# Patient Record
Sex: Female | Born: 1955 | Race: Black or African American | Hispanic: No | Smoking: Current every day smoker
Health system: Southern US, Community
[De-identification: ages and names within clinical notes are randomized; demographics above are authoritative.]

## PROBLEM LIST (undated history)

## (undated) DIAGNOSIS — E119 Type 2 diabetes mellitus without complications: Secondary | ICD-10-CM

## (undated) DIAGNOSIS — I10 Essential (primary) hypertension: Secondary | ICD-10-CM

## (undated) HISTORY — PX: VASCULAR SURGERY: SHX849

## (undated) HISTORY — PX: CARPAL TUNNEL RELEASE: SHX101

---

## 2015-10-29 ENCOUNTER — Encounter (HOSPITAL_COMMUNITY): Payer: Self-pay | Admitting: Emergency Medicine

## 2015-10-29 ENCOUNTER — Ambulatory Visit (INDEPENDENT_AMBULATORY_CARE_PROVIDER_SITE_OTHER): Payer: Self-pay

## 2015-10-29 ENCOUNTER — Ambulatory Visit (HOSPITAL_COMMUNITY)
Admission: EM | Admit: 2015-10-29 | Discharge: 2015-10-29 | Disposition: A | Discharge status: Home / Self Care | Payer: Self-pay | Attending: Family Medicine | Admitting: Family Medicine

## 2015-10-29 DIAGNOSIS — M609 Myositis, unspecified: Secondary | ICD-10-CM

## 2015-10-29 DIAGNOSIS — M5136 Other intervertebral disc degeneration, lumbar region: Secondary | ICD-10-CM

## 2015-10-29 DIAGNOSIS — M25511 Pain in right shoulder: Secondary | ICD-10-CM

## 2015-10-29 DIAGNOSIS — M545 Low back pain, unspecified: Secondary | ICD-10-CM

## 2015-10-29 DIAGNOSIS — M4316 Spondylolisthesis, lumbar region: Secondary | ICD-10-CM

## 2015-10-29 MED ORDER — MELOXICAM 15 MG PO TABS: 15.0000 mg | ORAL_TABLET | Freq: Every day | ORAL | Status: DC

## 2015-10-29 MED ORDER — HYDROCODONE-ACETAMINOPHEN 7.5-325 MG PO TABS: 1.0000 | ORAL_TABLET | ORAL | Status: DC | PRN

## 2015-10-29 NOTE — Discharge Instructions (Signed)
Back Pain, Adult °Back pain is very common in adults. The cause of back pain is rarely dangerous and the pain often gets better over time. The cause of your back pain may not be known. Some common causes of back pain include: °· Strain of the muscles or ligaments supporting the spine. °· Wear and tear (degeneration) of the spinal disks. °· Arthritis. °· Direct injury to the back. °For many people, back pain may return. Since back pain is rarely dangerous, most people can learn to manage this condition on their own. °HOME CARE INSTRUCTIONS °Watch your back pain for any changes. The following actions may help to lessen any discomfort you are feeling: °· Remain active. It is stressful on your back to sit or stand in one place for long periods of time. Do not sit, drive, or stand in one place for more than 30 minutes at a time. Take short walks on even surfaces as soon as you are able. Try to increase the length of time you walk each day. °· Exercise regularly as directed by your health care provider. Exercise helps your back heal faster. It also helps avoid future injury by keeping your muscles strong and flexible. °· Do not stay in bed. Resting more than 1-2 days can delay your recovery. °· Pay attention to your body when you bend and lift. The most comfortable positions are those that put less stress on your recovering back. Always use proper lifting techniques, including: °· Bending your knees. °· Keeping the load close to your body. °· Avoiding twisting. °· Find a comfortable position to sleep. Use a firm mattress and lie on your side with your knees slightly bent. If you lie on your back, put a pillow under your knees. °· Avoid feeling anxious or stressed. Stress increases muscle tension and can worsen back pain. It is important to recognize when you are anxious or stressed and learn ways to manage it, such as with exercise. °· Take medicines only as directed by your health care provider. Over-the-counter  medicines to reduce pain and inflammation are often the most helpful. Your health care provider may prescribe muscle relaxant drugs. These medicines help dull your pain so you can more quickly return to your normal activities and healthy exercise. °· Apply ice to the injured area: °· Put ice in a plastic bag. °· Place a towel between your skin and the bag. °· Leave the ice on for 20 minutes, 2-3 times a day for the first 2-3 days. After that, ice and heat may be alternated to reduce pain and spasms. °· Maintain a healthy weight. Excess weight puts extra stress on your back and makes it difficult to maintain good posture. °SEEK MEDICAL CARE IF: °· You have pain that is not relieved with rest or medicine. °· You have increasing pain going down into the legs or buttocks. °· You have pain that does not improve in one week. °· You have night pain. °· You lose weight. °· You have a fever or chills. °SEEK IMMEDIATE MEDICAL CARE IF:  °· You develop new bowel or bladder control problems. °· You have unusual weakness or numbness in your arms or legs. °· You develop nausea or vomiting. °· You develop abdominal pain. °· You feel faint. °  °This information is not intended to replace advice given to you by your health care provider. Make sure you discuss any questions you have with your health care provider. °  °Document Released: 03/31/2005 Document Revised: 04/21/2014 Document Reviewed: 08/02/2013 °Elsevier Interactive Patient Education ©2016 [employer]  Chronic Back Pain  When back pain lasts longer than 3 months, it is called chronic back pain.People with chronic back pain often go through certain periods that are more intense (flare-ups).  CAUSES Chronic back pain can be caused by wear and tear (degeneration) on different structures in your back. These structures include:  The bones of your spine (vertebrae) and the joints surrounding your spinal cord and nerve roots (facets).  The strong, fibrous tissues that  connect your vertebrae (ligaments). Degeneration of these structures may result in pressure on your nerves. This can lead to constant pain. HOME CARE INSTRUCTIONS  Avoid bending, heavy lifting, prolonged sitting, and activities which make the problem worse.  Take brief periods of rest throughout the day to reduce your pain. Lying down or standing usually is better than sitting while you are resting.  Take over-the-counter or prescription medicines only as directed by your caregiver. SEEK IMMEDIATE MEDICAL CARE IF:   You have weakness or numbness in one of your legs or feet.  You have trouble controlling your bladder or bowels.  You have nausea, vomiting, abdominal pain, shortness of breath, or fainting.   This information is not intended to replace advice given to you by your health care provider. Make sure you discuss any questions you have with your health care provider.   Document Released: 05/08/2004 Document Revised: 06/23/2011 Document Reviewed: 09/18/2014 Elsevier Interactive Patient Education 2016 [employer]  Facet Syndrome Facet syndrome is a condition where injury to the small joints between the bones in the spine (facet joints) causes back pain. Over rotation (twisting) or arching (extension) of the back may injure the joints or the soft disks between the spinal bones. Such injuries result in excessive motion of the facet joint. This causes the cartilage covering the facet joint to wear down. That places pressure on nerves, as they exit the spinal cord.  SYMPTOMS   Chronic dull ache in the low back, that gets worse with over-extension and rotation.  Pain in the low back, buttocks, hip, and sometimes leg.  Sometimes, stiffness of the low back. CAUSES  Facet syndrome is often caused by repeated or over rotation, over-extension, or extension with rotation of the back. These motions cause injury to the cartilage covering the facet joints. This places pressure on the spinal  nerves. RISK INCREASES WITH:  Sports that can cause over-extension of the back, with rotation or repeatedly (golf, football, gymnastics, diving, weight-lifting, dancing, rifle shooting, wrestling, tennis, swimming, volleyball, track and field, rugby, other contact sports).  Poor back strength and flexibility.  Poor exercise technique. PREVENTION   Learn and use proper technique.  Warm up and stretch properly before activity.  Maintain physical fitness:  Back and hamstring flexibility.  Back muscle strength and endurance.  Cardiovascular fitness. PROGNOSIS  This condition is often resolved with proper non-surgical treatment.  RELATED COMPLICATIONS   Recurring symptoms, resulting in a chronic problem.  Delayed healing, especially if sports are resumed too soon.  Prolonged impairment.  Narrowed canal for the spinal cord, due to bone spurs (bumps) resulting from chronic erosion of the facet joints (spinal stenosis). TREATMENT  Treatment first involves stopping activities that aggravate your symptoms. Ice and medicines may be used to reduce pain and inflammation. Your caregiver may advise strength and stretching activities, to be completed at home or with a therapist. You may be referred to a physical therapist for further treatment, including: ultrasound, manual adjustments, transcutaneous electronic nerve stimulation (TENS). Surgery is rarely needed. It  is reserved for athletes with persistent pain, despite 6 to 12 months of proper non-surgical treatment. Surgery involves joining (fusing) two bones of the spinal column, to stop motion between the facet joint and disk. MEDICATION   If pain medicine is needed, nonsteroidal anti-inflammatory medicines (aspirin and ibuprofen), or other minor pain relievers (acetaminophen), are often advised.  Do not take pain medicine for 7 days before surgery.  Stronger pain relievers may be prescribed. Use only as directed and only as much as you  need. HEAT AND COLD  Cold treatment (icing) relieves pain and reduces inflammation. Cold treatment should be applied for 10 to 15 minutes every 2 to 3 hours, and immediately after activity that aggravates your symptoms. Use ice packs or an ice massage.  Heat treatment may be used before performing stretching and strengthening activities advised by your caregiver, physical therapist, or athletic trainer. Use a heat pack or a warm water soak. SEEK MEDICAL CARE IF:   Symptoms get worse or do not improve in 2 to 4 weeks, despite treatment.  You develop numbness, weakness, or loss of bladder or bowel function.  New, unexplained symptoms develop. (Drugs used in treatment may produce side effects.)   This information is not intended to replace advice given to you by your health care provider. Make sure you discuss any questions you have with your health care provider.   Document Released: 03/31/2005 Document Revised: 06/23/2011 Document Reviewed: 12/11/2014 Elsevier Interactive Patient Education 2016 [employer]  Musculoskeletal Pain Musculoskeletal pain is muscle and boney aches and pains. These pains can occur in any part of the body. Your caregiver may treat you without knowing the cause of the pain. They may treat you if blood or urine tests, X-rays, and other tests were normal.  CAUSES There is often not a definite cause or reason for these pains. These pains may be caused by a type of germ (virus). The discomfort may also come from overuse. Overuse includes working out too hard when your body is not fit. Boney aches also come from weather changes. Bone is sensitive to atmospheric pressure changes. HOME CARE INSTRUCTIONS   Ask when your test results will be ready. Make sure you get your test results.  Only take over-the-counter or prescription medicines for pain, discomfort, or fever as directed by your caregiver. If you were given medications for your condition, do not drive, operate  machinery or power tools, or sign legal documents for 24 hours. Do not drink alcohol. Do not take sleeping pills or other medications that may interfere with treatment.  Continue all activities unless the activities cause more pain. When the pain lessens, slowly resume normal activities. Gradually increase the intensity and duration of the activities or exercise.  During periods of severe pain, bed rest may be helpful. Lay or sit in any position that is comfortable.  Putting ice on the injured area.  Put ice in a bag.  Place a towel between your skin and the bag.  Leave the ice on for 15 to 20 minutes, 3 to 4 times a day.  Follow up with your caregiver for continued problems and no reason can be found for the pain. If the pain becomes worse or does not go away, it may be necessary to repeat tests or do additional testing. Your caregiver may need to look further for a possible cause. SEEK IMMEDIATE MEDICAL CARE IF:  You have pain that is getting worse and is not relieved by medications.  You develop chest  pain that is associated with shortness or breath, sweating, feeling sick to your stomach (nauseous), or throw up (vomit).  Your pain becomes localized to the abdomen.  You develop any new symptoms that seem different or that concern you. MAKE SURE YOU:   Understand these instructions.  Will watch your condition.  Will get help right away if you are not doing well or get worse.   This information is not intended to replace advice given to you by your health care provider. Make sure you discuss any questions you have with your health care provider.   Document Released: 03/31/2005 Document Revised: 06/23/2011 Document Reviewed: 12/03/2012 Elsevier Interactive Patient Education 2016 [employer] Inc[employer]  Myofascial Pain Syndrome and Fibromyalgia Myofascial pain syndrome and fibromyalgia are both pain disorders. This pain may be felt mainly in your muscles.   Myofascial pain  syndrome:  Always has trigger points or tender points in the muscle that will cause pain when pressed. The pain may come and go.  Usually affects your neck, upper back, and shoulder areas. The pain often radiates into your arms and hands.  Fibromyalgia:  Has muscle pains and tenderness that come and go.  Is often associated with fatigue and sleep disturbances.  Has trigger points.  Tends to be long-lasting (chronic), but is not life-threatening. Fibromyalgia and myofascial pain are not the same. However, they often occur together. If you have both conditions, each can make the other worse. Both are common and can cause enough pain and fatigue to make day-to-day activities difficult.  CAUSES  The exact causes of fibromyalgia and myofascial pain are not known. People with certain gene types may be more likely to develop fibromyalgia. Some factors can be triggers for both conditions, such as:   Spine disorders.  Arthritis.  Severe injury (trauma) and other physical stressors.  Being under a lot of stress.  A medical illness. SIGNS AND SYMPTOMS  Fibromyalgia The main symptom of fibromyalgia is widespread pain and tenderness in your muscles. This can vary over time. Pain is sometimes described as stabbing, shooting, or burning. You may have tingling or numbness, too. You may also have sleep problems and fatigue. You may wake up feeling tired and groggy (fibro fog). Other symptoms may include:   Bowel and bladder problems.  Headaches.  Visual problems.  Problems with odors and noises.  Depression or mood changes.  Painful menstrual periods (dysmenorrhea).  Dry skin or eyes. Myofascial pain syndrome Symptoms of myofascial pain syndrome include:   Tight, ropy bands of muscle.   Uncomfortable sensations in muscular areas, such as:  Aching.  Cramping.  Burning.  Numbness.  Tingling.   Muscle weakness.  Trouble moving certain muscles freely (range of  motion). DIAGNOSIS  There are no specific tests to diagnose fibromyalgia or myofascial pain syndrome. Both can be hard to diagnose because their symptoms are common in many other conditions. Your health care provider may suspect one or both of these conditions based on your symptoms and medical history. Your health care provider will also do a physical exam.  The key to diagnosing fibromyalgia is having pain, fatigue, and other symptoms for more than three months that cannot be explained by another condition.  The key to diagnosing myofascial pain syndrome is finding trigger points in muscles that are tender and cause pain elsewhere in your body (referred pain). TREATMENT  Treating fibromyalgia and myofascial pain often requires a team of health care providers. This usually starts with your primary provider and a physical  therapist. Alison Duncan may also find it helpful to work with alternative health care providers, such as massage therapists or acupuncturists. Treatment for fibromyalgia may include medicines. This may include nonsteroidal anti-inflammatory drugs (NSAIDs), along with other medicines.  Treatment for myofascial pain may also include:  NSAIDs.  Cooling and stretching of muscles.  Trigger point injections.  Sound wave (ultrasound) treatments to stimulate muscles. HOME CARE INSTRUCTIONS   Take medicines only as directed by your health care provider.  Exercise as directed by your health care provider or physical therapist.  Try to avoid stressful situations.  Practice relaxation techniques to control your stress. You may want to try:  Biofeedback.  Visual imagery.  Hypnosis.  Muscle relaxation.  Yoga.  Meditation.  Talk to your health care provider about alternative treatments, such as acupuncture or massage treatment.  Maintain a healthy lifestyle. This includes eating a healthy diet and getting enough sleep.  Consider joining a support group.  Do not do activities  that stress or strain your muscles. That includes repetitive motions and heavy lifting. SEEK MEDICAL CARE IF:   You have new symptoms.  Your symptoms get worse.  You have side effects from your medicines.  You have trouble sleeping.  Your condition is causing depression or anxiety. FOR MORE INFORMATION   National Fibromyalgia Association: http://www.fmaware.orgwww.fmaware.org  Arthritis Foundation: http://www.arthritis.orgwww.arthritis.org  American Chronic Pain Association: michaeledo.com.CandyDash.co.za   This information is not intended to replace advice given to you by your health care provider. Make sure you discuss any questions you have with your health care provider.   Document Released: 03/31/2005 Document Revised: 04/21/2014 Document Reviewed: 01/04/2014 Elsevier Interactive Patient Education [employer]
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
## 2016-01-25 ENCOUNTER — Emergency Department (HOSPITAL_COMMUNITY): Payer: Medicare Other

## 2016-01-25 ENCOUNTER — Emergency Department (HOSPITAL_COMMUNITY)
Admission: EM | Admit: 2016-01-25 | Discharge: 2016-01-25 | Disposition: A | Discharge status: Home / Self Care | Payer: Medicare Other | Attending: Emergency Medicine | Admitting: Emergency Medicine

## 2016-01-25 ENCOUNTER — Encounter (HOSPITAL_COMMUNITY): Payer: Self-pay | Admitting: Emergency Medicine

## 2016-01-25 DIAGNOSIS — Z794 Long term (current) use of insulin: Secondary | ICD-10-CM | POA: Insufficient documentation

## 2016-01-25 DIAGNOSIS — R05 Cough: Secondary | ICD-10-CM | POA: Insufficient documentation

## 2016-01-25 DIAGNOSIS — Z7982 Long term (current) use of aspirin: Secondary | ICD-10-CM | POA: Diagnosis not present

## 2016-01-25 DIAGNOSIS — F17200 Nicotine dependence, unspecified, uncomplicated: Secondary | ICD-10-CM | POA: Insufficient documentation

## 2016-01-25 DIAGNOSIS — I10 Essential (primary) hypertension: Secondary | ICD-10-CM | POA: Insufficient documentation

## 2016-01-25 DIAGNOSIS — R059 Cough, unspecified: Secondary | ICD-10-CM

## 2016-01-25 DIAGNOSIS — E119 Type 2 diabetes mellitus without complications: Secondary | ICD-10-CM | POA: Insufficient documentation

## 2016-01-25 HISTORY — DX: Type 2 diabetes mellitus without complications: E11.9

## 2016-01-25 HISTORY — DX: Essential (primary) hypertension: I10

## 2016-01-25 LAB — BASIC METABOLIC PANEL
Anion gap: 7 (ref 5–15)
BUN: 8 mg/dL (ref 6–20)
CALCIUM: 9.2 mg/dL (ref 8.9–10.3)
CHLORIDE: 103 mmol/L (ref 101–111)
CO2: 28 mmol/L (ref 22–32)
CREATININE: 0.72 mg/dL (ref 0.44–1.00)
GFR calc Af Amer: >60 mL/min (ref >60)
GFR calc non Af Amer: >60 mL/min (ref >60)
GLUCOSE: 318 mg/dL — AB (ref 65–99)
Potassium: 3.5 mmol/L (ref 3.5–5.1)
Sodium: 138 mmol/L (ref 135–145)

## 2016-01-25 LAB — CBC WITH DIFFERENTIAL/PLATELET
BASOS PCT: 0 %
Basophils Absolute: 0 10*3/uL (ref 0.0–0.1)
EOS ABS: 0.1 10*3/uL (ref 0.0–0.7)
Eosinophils Relative: 1 %
HCT: 40 % (ref 36.0–46.0)
HEMOGLOBIN: 13.7 g/dL (ref 12.0–15.0)
LYMPHS ABS: 2.4 10*3/uL (ref 0.7–4.0)
Lymphocytes Relative: 29 %
MCH: 28.5 pg (ref 26.0–34.0)
MCHC: 34.3 g/dL (ref 30.0–36.0)
MCV: 83.3 fL (ref 78.0–100.0)
MONO ABS: 0.5 10*3/uL (ref 0.1–1.0)
MONOS PCT: 6 %
Neutro Abs: 5.5 10*3/uL (ref 1.7–7.7)
Neutrophils Relative %: 64 %
Platelets: 255 10*3/uL (ref 150–400)
RBC: 4.8 MIL/uL (ref 3.87–5.11)
RDW: 15.2 % (ref 11.5–15.5)
WBC: 8.4 10*3/uL (ref 4.0–10.5)

## 2016-01-25 LAB — I-STAT TROPONIN, ED: Troponin i, poc: 0 ng/mL (ref 0.00–0.08)

## 2016-01-25 LAB — SEDIMENTATION RATE: SED RATE: 52 mm/h — AB (ref 0–22)

## 2016-01-25 LAB — C-REACTIVE PROTEIN: CRP: 1 mg/dL — AB (ref <1.0)

## 2016-01-25 MED ORDER — BENZONATATE 100 MG PO CAPS
100.0000 mg | ORAL_CAPSULE | Freq: Once | ORAL | Status: AC
  Administered 2016-01-25: 100 mg via ORAL
  Filled 2016-01-25: qty 1

## 2016-01-25 MED ORDER — IOPAMIDOL (ISOVUE-300) INJECTION 61%
INTRAVENOUS | Status: AC
  Administered 2016-01-25: 75 mL
  Filled 2016-01-25: qty 75

## 2016-01-25 MED ORDER — IPRATROPIUM-ALBUTEROL 0.5-2.5 (3) MG/3ML IN SOLN
3.0000 mL | Freq: Once | RESPIRATORY_TRACT | Status: AC
  Administered 2016-01-25: 3 mL via RESPIRATORY_TRACT
  Filled 2016-01-25: qty 3

## 2016-01-25 NOTE — ED Notes (Signed)
Pt returned from CT °

## 2016-01-25 NOTE — ED Notes (Signed)
Patient transported to CT 

## 2016-01-25 NOTE — ED Triage Notes (Signed)
60 yo female, smoker, c/o persistent productive cough, white sputum. Also, c/o sore throat and CP with cough.

## 2016-01-25 NOTE — ED Notes (Signed)
LAB TECH IN TO DRAW BLOOD.
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
## 2016-01-25 NOTE — ED Notes (Signed)
Returned from xray

## 2016-01-25 NOTE — ED Notes (Signed)
PT SIGNED OUT AMA. PT AGREED TO RETURN THIS PM AFTER SETTLING FAMILY.

## 2016-01-25 NOTE — ED Notes (Signed)
Patient transported to X-ray 

## 2016-01-26 LAB — HIV ANTIBODY (ROUTINE TESTING W REFLEX): HIV Screen 4th Generation wRfx: NONREACTIVE

## 2016-01-28 ENCOUNTER — Emergency Department (HOSPITAL_COMMUNITY)
Admission: EM | Admit: 2016-01-28 | Discharge: 2016-01-28 | Disposition: A | Discharge status: Home / Self Care | Payer: Medicare Other | Attending: Emergency Medicine | Admitting: Emergency Medicine

## 2016-01-28 ENCOUNTER — Encounter (HOSPITAL_COMMUNITY): Payer: Self-pay | Admitting: Emergency Medicine

## 2016-01-28 DIAGNOSIS — R05 Cough: Secondary | ICD-10-CM

## 2016-01-28 DIAGNOSIS — Z7982 Long term (current) use of aspirin: Secondary | ICD-10-CM | POA: Insufficient documentation

## 2016-01-28 DIAGNOSIS — F17210 Nicotine dependence, cigarettes, uncomplicated: Secondary | ICD-10-CM | POA: Insufficient documentation

## 2016-01-28 DIAGNOSIS — E119 Type 2 diabetes mellitus without complications: Secondary | ICD-10-CM | POA: Insufficient documentation

## 2016-01-28 DIAGNOSIS — Z7984 Long term (current) use of oral hypoglycemic drugs: Secondary | ICD-10-CM | POA: Diagnosis not present

## 2016-01-28 DIAGNOSIS — I10 Essential (primary) hypertension: Secondary | ICD-10-CM | POA: Insufficient documentation

## 2016-01-28 DIAGNOSIS — R918 Other nonspecific abnormal finding of lung field: Secondary | ICD-10-CM | POA: Diagnosis not present

## 2016-01-28 DIAGNOSIS — Z794 Long term (current) use of insulin: Secondary | ICD-10-CM | POA: Insufficient documentation

## 2016-01-28 DIAGNOSIS — Z79899 Other long term (current) drug therapy: Secondary | ICD-10-CM | POA: Insufficient documentation

## 2016-01-28 DIAGNOSIS — R059 Cough, unspecified: Secondary | ICD-10-CM

## 2016-01-28 LAB — CBC WITH DIFFERENTIAL/PLATELET
BASOS PCT: 1 %
Basophils Absolute: 0.1 10*3/uL (ref 0.0–0.1)
EOS PCT: 2 %
Eosinophils Absolute: 0.2 10*3/uL (ref 0.0–0.7)
HEMATOCRIT: 37.4 % (ref 36.0–46.0)
HEMOGLOBIN: 12.8 g/dL (ref 12.0–15.0)
LYMPHS PCT: 34 %
Lymphs Abs: 2.9 10*3/uL (ref 0.7–4.0)
MCH: 28.3 pg (ref 26.0–34.0)
MCHC: 34.2 g/dL (ref 30.0–36.0)
MCV: 82.6 fL (ref 78.0–100.0)
MONO ABS: 0.5 10*3/uL (ref 0.1–1.0)
MONOS PCT: 6 %
NEUTROS PCT: 57 %
Neutro Abs: 4.7 10*3/uL (ref 1.7–7.7)
PLATELETS: 248 10*3/uL (ref 150–400)
RBC: 4.53 MIL/uL (ref 3.87–5.11)
RDW: 15 % (ref 11.5–15.5)
WBC: 8.4 10*3/uL (ref 4.0–10.5)

## 2016-01-28 LAB — HEPATIC FUNCTION PANEL
ALBUMIN: 3 g/dL — AB (ref 3.5–5.0)
ALT: 13 U/L — ABNORMAL LOW (ref 14–54)
AST: 18 U/L (ref 15–41)
Alkaline Phosphatase: 98 U/L (ref 38–126)
Bilirubin, Direct: 0.1 mg/dL (ref 0.1–0.5)
Indirect Bilirubin: 0.5 mg/dL (ref 0.3–0.9)
TOTAL PROTEIN: 6.7 g/dL (ref 6.5–8.1)
Total Bilirubin: 0.6 mg/dL (ref 0.3–1.2)

## 2016-01-28 LAB — CBG MONITORING, ED: GLUCOSE-CAPILLARY: 178 mg/dL — AB (ref 65–99)

## 2016-01-28 MED ORDER — AZITHROMYCIN 250 MG PO TABS: 250.0000 mg | ORAL_TABLET | Freq: Every day | ORAL | 0 refills | Status: DC

## 2016-01-28 MED ORDER — CEFDINIR 300 MG PO CAPS: 300.0000 mg | ORAL_CAPSULE | Freq: Two times a day (BID) | ORAL | 0 refills | Status: DC

## 2016-01-28 NOTE — ED Provider Notes (Addendum)
[facility] DEPT Provider Note   CSN: [id number] Arrival date & time: 01/28/16  4098     History   Chief Complaint Chief Complaint  Patient presents with  . Abnormal Lab  . Cough    HPI Alison Duncan is a 60 y.o. female.  HPI Patient presents after leaving AMA from the ER 3 days ago. She has had a cough for about a year. Worse last 3 weeks. No weight loss. Has white sputum occasionally. Seen Friday had an abnormal chest x-ray and CT scan that showed multiple cavitary lesions. Patient states she was not able to stay that she had some family issues. No fevers. No weight loss. He does smoke cigarettes. No known sick contacts. She moved to [place] 2 months ago. She does not have a primary care doctor here yet.  Past Medical History:  Diagnosis Date  . Diabetes mellitus without complication (HCC)   . Hypertension     There are no active problems to display for this patient.   Past Surgical History:  Procedure Laterality Date  . CARPAL TUNNEL RELEASE    . VASCULAR SURGERY     STENT IN LEFT LEG.    OB History    No data available       Home Medications    Prior to Admission medications   Medication Sig Start Date End Date Taking? Authorizing Provider  aspirin 81 MG tablet Take 81 mg by mouth daily.    Historical Provider, MD  HYDROcodone-acetaminophen (NORCO) 7.5-325 MG tablet Take 1 tablet by mouth every 4 (four) hours as needed. 10/29/15   Hayden Rasmussen, NP  insulin detemir (LEVEMIR) 100 UNIT/ML injection Inject into the skin at bedtime.    Historical Provider, MD  Iron Combinations (NIFEREX PO) Take by mouth.    Historical Provider, MD  meloxicam (MOBIC) 15 MG tablet Take 1 tablet (15 mg total) by mouth daily. 10/29/15   Hayden Rasmussen, NP  sitaGLIPtin (JANUVIA) 50 MG tablet Take 50 mg by mouth daily.    Historical Provider, MD    Family History History reviewed. No pertinent family history.  Social History Social History  Substance Use Topics  . Smoking  status: Current Every Day Smoker  . Smokeless tobacco: Never Used  . Alcohol use Yes     Allergies   Review of patient's allergies indicates no known allergies.   Review of Systems Review of Systems  Constitutional: Negative for appetite change and unexpected weight change.  HENT: Negative for dental problem.   Respiratory: Positive for cough and shortness of breath.   Cardiovascular: Negative for chest pain.  Gastrointestinal: Negative for abdominal pain.  Genitourinary: Negative for dysuria.  Musculoskeletal: Negative for back pain.  Neurological: Negative for seizures.  Psychiatric/Behavioral: Negative for confusion.     Physical Exam Updated Vital Signs BP 138/97 (BP Location: Right Arm)   Pulse 85   Temp 98.2 F (36.8 C) (Oral)   Resp 18   Ht 5\' 4"  (1.626 m)   Wt 185 lb (83.9 kg)   SpO2 100%   BMI 31.76 kg/m   Physical Exam  Constitutional: She appears well-developed.  HENT:  Head: Atraumatic.  Cardiovascular: Normal rate.   Pulmonary/Chest: No respiratory distress.  Mild scattered wheezes.  Abdominal: Soft. There is no tenderness.  Musculoskeletal: Normal range of motion.  Neurological: She is alert.  Skin: Skin is warm. Capillary refill takes less than 2 seconds.     ED Treatments / Results  Labs (all labs ordered are listed,  but only abnormal results are displayed) Labs Reviewed  CBG MONITORING, ED - Abnormal; Notable for the following:       Result Value   Glucose-Capillary 178 (*)    All other components within normal limits    EKG  EKG Interpretation None       Radiology No results found.  Procedures Procedures (including critical care time)  Medications Ordered in ED Medications - No data to display   Initial Impression / Assessment and Plan / ED Course  I have reviewed the triage vital signs and the nursing notes.  Pertinent labs & imaging results that were available during my care of the patient were reviewed by me and  considered in my medical decision making (see chart for details).  Clinical Course  Patient presents with shortness of breath. Seen on Friday and had CT scan with multiple nodules. Would not be admitted that time. Now returns. Does not have follow-up. To be seen by internal medicine residents.  Internal medicine is seen the patient and arrange follow-up in 2 days in the office. We'll give antibiotics. A few more labs added. Discharge home.  Final Clinical Impressions(s) / ED Diagnoses   Final diagnoses:  Cough  Multiple lung nodules on CT    New Prescriptions New Prescriptions   No medications on file     Benjiman CoreNathan Nayah Lukens, MD 01/28/16 1212    Benjiman CoreNathan Selisa Tensley, MD 01/28/16 [phone]

## 2016-01-28 NOTE — ED Notes (Signed)
Hospitalist MD at bedside. 

## 2016-01-28 NOTE — ED Triage Notes (Signed)
Pt sts seen here Friday and they wanted to keep here for eval of abnormal chest xray; pt returned today with cough

## 2016-01-28 NOTE — Consult Note (Signed)
Date: 01/28/2016               Patient Name:  Alison Duncan MRN: [id number]  DOB: 31-Aug-1955 Age / Sex: 60 y.o., female   PCP: No Pcp Per Patient         Requesting Physician: Dr. Bonnetta BarryNo att. providers found    Consulting Reason:  Pulmonary Nodules      Chief Complaint: Cough  History of Present Illness: Patient is a 60 yo F pmhx of peripheral vascular disease (s/p LLE stent), DM2 on insulin, and HTN who presents with worsening of her chronic cough x 1 year. She presented to the ER 3 days ago for the same complaint and was found to have multiple, bilateral nodules on chest CT. She left AMA due to a family issue and presented again today for the same complaint. Patient is a smoker, currently smoking 5-6 cigarettes/day. She cut back about 1 year ago when she started using an electronic cigarette, and was previously smoking 1 PPD for many years. Patient says her cough started around the same time that she began using an electronic cigarette, but has progressively worsened over the past few weeks. She endorses productive sputum that is white in color and subjective fevers at home. She also endorses night sweats but attributes this to menopause. She denies weight loss and chills. She travels back and forth between [place] and [place] multiple times a year. She denies any other travel and has never lived anywhere else besides [place] and [place]. Her only TB risk factor is a nephew that was recently incarcerated and visited with her family. She has never been incarcerated herself and she has never been homeless. She denies IV drug use. Other family members have also been sick with a similar cough and also came to the ED with her 3 days ago for evaluation, but patient says their imaging was normal.   Today in the ED, vitals were stable (T98.9, BP 146/96, HR 96, RR 20, 100% on RA). Review of prior chest CT images show innumerable small nodules throughout the mid and upper lungs bilaterally, some  with central cavitation and many with ground-glass densities. Also noted was an incidental finding of a small hyper enhancing lesion within the left liver lobe measuring 1 cm. Non emergent follow up liver MRI recommended per radiology.   Meds: No current facility-administered medications for this encounter.    Current Outpatient Prescriptions  Medication Sig Dispense Refill  . aspirin 81 MG tablet Take 81 mg by mouth daily.    Marland Kitchen. azithromycin (ZITHROMAX) 250 MG tablet Take 1 tablet (250 mg total) by mouth daily. Take first 2 tablets together, then 1 every day until finished. 6 tablet 0  . cefdinir (OMNICEF) 300 MG capsule Take 1 capsule (300 mg total) by mouth 2 (two) times daily. 14 capsule 0  . HYDROcodone-acetaminophen (NORCO) 7.5-325 MG tablet Take 1 tablet by mouth every 4 (four) hours as needed. 20 tablet 0  . insulin detemir (LEVEMIR) 100 UNIT/ML injection Inject into the skin at bedtime.    . Iron Combinations (NIFEREX PO) Take by mouth.    . meloxicam (MOBIC) 15 MG tablet Take 1 tablet (15 mg total) by mouth daily. 14 tablet 0  . sitaGLIPtin (JANUVIA) 50 MG tablet Take 50 mg by mouth daily.      Allergies: Allergies as of 01/28/2016  . (No Known Allergies)   Past Medical History:  Diagnosis Date  . Diabetes mellitus without complication (HCC)   .  Hypertension    Past Surgical History:  Procedure Laterality Date  . CARPAL TUNNEL RELEASE    . VASCULAR SURGERY     STENT IN LEFT LEG.   History reviewed. No pertinent family history. Social History   Social History  . Marital status: Widowed    Spouse name: N/A  . Number of children: N/A  . Years of education: N/A   Occupational History  . Not on file.   Social History Main Topics  . Smoking status: Current Every Day Smoker  . Smokeless tobacco: Never Used  . Alcohol use Yes  . Drug use: Unknown  . Sexual activity: Not on file   Other Topics Concern  . Not on file   Social History Narrative  . No narrative on  file    Review of Systems: Pertinent items noted in HPI and remainder of comprehensive ROS otherwise negative.  Physical Exam: Blood pressure 139/83, pulse 83, temperature 98.9 F (37.2 C), temperature source Oral, resp. rate 16, height 5\' 4"  (1.626 m), weight 185 lb (83.9 kg), SpO2 100 %. BP 139/83 (BP Location: Left Arm)   Pulse 83   Temp 98.9 F (37.2 C) (Oral)   Resp 16   Ht 5\' 4"  (1.626 m)   Wt 185 lb (83.9 kg)   SpO2 100%   BMI 31.76 kg/m   Physical Exam Constitutional: NAD, appears comfortable  Neck: Supple, trachea midline.  Cardiovascular: RRR, no murmurs, rubs, or gallops.  Pulmonary/Chest: Scant wheezing and mild crackles bilaterally. No chest wall abnormalities.  Abdominal: Soft, non tender, non distended. +BS.  Extremities: Warm and well perfused. Distal pulses intact. No edema.  Neurological: A&Ox3, CN II - XII grossly intact.  Skin: No rashes or erythema  Psychiatric: Normal mood and affect  01/26/16 ECG: Personally reviewed, normal sinus rhythm   01/26/16 Chest CT:  IMPRESSION: 1. Innumerable small nodules throughout the mid and upper lungs bilaterally, many with central cavitation, many with surrounding ground-glass density. Differential includes atypical infection such as fungal (aspergillosis) and viral (CMV, influenza, varicella. etc). Differential also includes septic emboli and metastases (hemorrhagic). Less likely differential would also include vasculitides and hypersensitivity pneumonitis. 2. Aortic atherosclerosis. 3. Coronary artery calcifications, particularly dense within the left anterior descending coronary artery. Recommend correlation with any possible associated cardiac symptoms. 4. Amorphous soft tissue density material within the anterior mediastinum, most likely normal residual thymic tissue although less commonly seen at this patient's age. 5. Mild emphysematous change within the lung apices. 6. **An incidental finding of  potential clinical significance has been found. Subtle hyperenhancing lesion within the left liver lobe measuring 1 cm greatest dimension, too small to definitively characterize, possibly merely an atypical benign hemangioma or vascular anomaly. Recommend nonemergent follow-up with liver protocol MRI when current issues are resolved.**  Assessment, Plan, & Recommendations by Problem:  Pulmonary nodules: Patient presented with progression of her chronic cough over the past few weeks, found to have multiple pulmonary nodules on chest CT concerning for infection (likely fungal) vs. Metastatic disease. Patient travels back and forth between [place] and [place] but denies other travel. Her only TB risk factor is a nephew who was recently incarcerated and visited with the family. She does have a family history of colon cancer (uncle and mom, both deceased) but has regular colonoscopies every 5 years. Patient is afebrile, sating well on room air, and without leukocytosis. Hemodynamically stable for outpatient work up. -- CRP of 1.0 and SED rate 52 -- CBC 3 days ago with wbc  of 8.4. Repeat CBC today.  -- 01/25/16 blood cultures no growth for 3 days -- Ceftin and Azithromycin for CAP coverage -- Follow up IM clinic on 10/18  Left Liver Lobe lesion: Found incidentally on CT chest. Patient does not drink alcohol and denies other drug use. Non emergent follow up liver MRI recommended per radiology.  -- Hepatic function panel pending -- F/u outpatient for further imaging  Urinary Frequency: Patient was recently called by her PCP's office (Dr. Tia Masker) in [place] with lab abnormality. Patient is unsure if it was blood or urine. PCP asking her to come back in for evaluation. Patient is endorsing urinary frequency. -- Check UA   Dispo: Discharge today from ER with plans for close outpatient follow up.   The patient does have a current PCP in [place] (Dr. Tia Masker) but needs local outpatient follow up. She is scheduled  for follow up in the [facility] clinic on 01/30/16 at 8:45am.   The patient does not have transportation limitations that hinder transportation to clinic appointments.  Signed: Reymundo Poll, MD 01/28/2016, 2:35 PM

## 2016-01-29 ENCOUNTER — Emergency Department (HOSPITAL_COMMUNITY)
Admission: EM | Admit: 2016-01-29 | Discharge: 2016-01-29 | Disposition: A | Discharge status: Home / Self Care | Payer: Medicare Other | Attending: Emergency Medicine | Admitting: Emergency Medicine

## 2016-01-29 ENCOUNTER — Encounter (HOSPITAL_COMMUNITY): Payer: Self-pay | Admitting: Emergency Medicine

## 2016-01-29 DIAGNOSIS — E119 Type 2 diabetes mellitus without complications: Secondary | ICD-10-CM | POA: Diagnosis not present

## 2016-01-29 DIAGNOSIS — R0602 Shortness of breath: Secondary | ICD-10-CM | POA: Insufficient documentation

## 2016-01-29 DIAGNOSIS — F17210 Nicotine dependence, cigarettes, uncomplicated: Secondary | ICD-10-CM | POA: Diagnosis not present

## 2016-01-29 DIAGNOSIS — I10 Essential (primary) hypertension: Secondary | ICD-10-CM | POA: Insufficient documentation

## 2016-01-29 DIAGNOSIS — Z5321 Procedure and treatment not carried out due to patient leaving prior to being seen by health care provider: Secondary | ICD-10-CM | POA: Insufficient documentation

## 2016-01-29 DIAGNOSIS — Z7982 Long term (current) use of aspirin: Secondary | ICD-10-CM | POA: Insufficient documentation

## 2016-01-29 LAB — CBC WITH DIFFERENTIAL/PLATELET
BASOS PCT: 1 %
Basophils Absolute: 0.1 10*3/uL (ref 0.0–0.1)
EOS ABS: 0.1 10*3/uL (ref 0.0–0.7)
Eosinophils Relative: 1 %
HCT: 41.7 % (ref 36.0–46.0)
Hemoglobin: 14.4 g/dL (ref 12.0–15.0)
Lymphocytes Relative: 27 %
Lymphs Abs: 2.6 10*3/uL (ref 0.7–4.0)
MCH: 28.6 pg (ref 26.0–34.0)
MCHC: 34.5 g/dL (ref 30.0–36.0)
MCV: 82.9 fL (ref 78.0–100.0)
MONO ABS: 0.6 10*3/uL (ref 0.1–1.0)
Monocytes Relative: 6 %
NEUTROS ABS: 6.2 10*3/uL (ref 1.7–7.7)
Neutrophils Relative %: 65 %
PLATELETS: 274 10*3/uL (ref 150–400)
RBC: 5.03 MIL/uL (ref 3.87–5.11)
RDW: 15 % (ref 11.5–15.5)
WBC: 9.6 10*3/uL (ref 4.0–10.5)

## 2016-01-29 LAB — BASIC METABOLIC PANEL
Anion gap: 11 (ref 5–15)
BUN: 10 mg/dL (ref 6–20)
CO2: 23 mmol/L (ref 22–32)
CREATININE: 0.71 mg/dL (ref 0.44–1.00)
Calcium: 9.4 mg/dL (ref 8.9–10.3)
Chloride: 103 mmol/L (ref 101–111)
GFR calc Af Amer: >60 mL/min (ref >60)
Glucose, Bld: 229 mg/dL — ABNORMAL HIGH (ref 65–99)
Potassium: 4.1 mmol/L (ref 3.5–5.1)
SODIUM: 137 mmol/L (ref 135–145)

## 2016-01-29 MED ORDER — ALBUTEROL SULFATE (2.5 MG/3ML) 0.083% IN NEBU
5.0000 mg | INHALATION_SOLUTION | Freq: Once | RESPIRATORY_TRACT | Status: DC
  Administered 2016-01-29: 5 mg via RESPIRATORY_TRACT

## 2016-01-29 MED ORDER — ALBUTEROL SULFATE (2.5 MG/3ML) 0.083% IN NEBU: 5.0000 mg | INHALATION_SOLUTION | Freq: Once | RESPIRATORY_TRACT | Status: DC

## 2016-01-29 MED ORDER — ALBUTEROL SULFATE (2.5 MG/3ML) 0.083% IN NEBU
INHALATION_SOLUTION | RESPIRATORY_TRACT | Status: AC
  Filled 2016-01-29: qty 6

## 2016-01-29 NOTE — ED Notes (Signed)
Called patient x 2.   Xray also called patient.  She was not in waiting room.   I called patient on cell phone and spoke with her.  She states that she will get antibiotic filled that she was prescribed yesterday.   Patient states she did not want to wait.

## 2016-01-29 NOTE — ED Triage Notes (Signed)
Patient brough in by EMS.  Patient had been seen last Friday and then again yesterday for same.  "They told me I have an infection or fungus in my lungs".   Patient states SOB, but talking in full sentences at triage.   Patient denies other symptoms at this time.

## 2016-01-30 ENCOUNTER — Ambulatory Visit (INDEPENDENT_AMBULATORY_CARE_PROVIDER_SITE_OTHER): Payer: Medicare Other | Admitting: Internal Medicine

## 2016-01-30 ENCOUNTER — Encounter: Payer: Self-pay | Admitting: Internal Medicine

## 2016-01-30 VITALS — BP 138/77 | HR 95 | Temp 98.3°F | Ht 64.0 in | Wt 184.8 lb

## 2016-01-30 DIAGNOSIS — R918 Other nonspecific abnormal finding of lung field: Secondary | ICD-10-CM | POA: Diagnosis not present

## 2016-01-30 DIAGNOSIS — F17210 Nicotine dependence, cigarettes, uncomplicated: Secondary | ICD-10-CM

## 2016-01-30 DIAGNOSIS — Z9189 Other specified personal risk factors, not elsewhere classified: Secondary | ICD-10-CM

## 2016-01-30 LAB — CULTURE, BLOOD (ROUTINE X 2)
CULTURE: NO GROWTH
Culture: NO GROWTH

## 2016-01-30 NOTE — Assessment & Plan Note (Addendum)
HPI Patient is presenting with a 1 year history of chronic cough productive of white sputum. States she moved to [facility] from [place] in July, 2017. Staes she has a history of asthma and has been using her Albuterol MDI which is not helping her current symptoms. Reports having occasional fevers and chills. Reports having pleuritic chest pain. Denies having any fatigue, loss of appetite, or weight loss. She is a current 1/2 PPD smoker, states previously she was smoking 1 PPD x 20 yrs. Denies any travel outside the country. Denies having any hemoptysis. 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 She does not believe she has been exposed to anyone with TB, however, does mention exposure to a family member - nephew who was incarcerated and recently released from jail. States her last colonoscopy was this year in [place]; she was found to have colon polyps which were removed. Denies having any melena, hematochezia, or hematemesis. Denies having any hematuria or dysuria.  In the ED, CT of chest with contrast showing innumerable small nodules throughout the mid and upper lungs bilaterally, many with central cavitation, many with surrounding ground-glass density. Also showing an amorphous soft tissue density material within the anterior mediastinum. Showing an incidental finding of subtle hyperenhancing lesion within the left liver lobe measuring 1 cm greatest dimension which the radiologist read as possibly merely an atypical benign hemangioma or vascular anomaly. They recommend nonemergent follow-up with liver protocol MRI when current issues are resolved.  Lab workup was grossly normal including CBC, BMP, and LFTs. Troponin normal and HIV antibody negative. CRP borderline elevated 1.0 and ESR elevated at 52. Blood culture showing NGTD. She was discharged with a 5 day course of azithromycin and 7 day course of cefdinir. Patient reports taking both antibiotics at present.    Assessment Differential for multiple pulmonary nodules is wide: TB, atypical infections (viral or fungal), septic emboli, metastasis, vasculitis, hypersensitivity pneumonitis, parasitic infections, pulmonary AVMs, pneumoconioses, and granulomatosis with polyangitis. However, tuberculosis is high on the differential since many of the pulmonary nodules have central cavitation and patient was exposed to a high risk individual recently. Malignancy less likely as patient is not complaining of any fatigue or weight loss.   Plan -Quantiferon gold assay -Referrals to pulmonology and infectious disease have been placed. If quantiferon negative, consider seeking help from pulmonology for possible bronchoscopy + biopsy of the soft tissue mass and/ or nodules. If quantiferon positive, notify the health department and seek further assistance from infectious disease.  -Patient will need a liver MRI at future visit   Addendum 02/04/16 at 8:32 am: Quantiferon TB gold negative. Spoke to the patient over the phone. States her cough has improved with antibiotics. Advised her to see pulmonology as soon as possible. Our staff is currently working on setting an appointment for her.

## 2016-01-30 NOTE — Patient Instructions (Signed)
Ms. Alison Duncan it was nice meeting you today.  -Please return for a follow-up visit in 2 weeks.

## 2016-01-30 NOTE — Progress Notes (Signed)
   CC: Patient is here for an ED follow-up of pulmonary nodules.   HPI:  [name] is a 60 y.o. F with a PMHx of conditions listed below presenting to the clinic for an ED follow-up of pulmonary nodules. Please see problem based charting for the status of the patient's current and chronic medical conditions.    Past Medical History:  Diagnosis Date  . Diabetes mellitus without complication (HCC)   . Hypertension     Review of Systems:  Pertinent positives mentioned in HPI. Remainder of all ROS negative.   Physical Exam:  Vitals:   01/30/16 0901  BP: 138/77  Pulse: 95  Temp: 98.3 F (36.8 C)  TempSrc: Oral  SpO2: 100%  Weight: 184 lb 12.8 oz (83.8 kg)  Height: 5\' 4"  (1.626 m)   Physical Exam  Constitutional: She is oriented to person, place, and time. She appears well-developed and well-nourished. No distress.  HENT:  Head: Normocephalic and atraumatic.  Mouth/Throat: Oropharynx is clear and moist.  Eyes: EOM are normal. Right eye exhibits no discharge. Left eye exhibits no discharge.  Neck: Neck supple. No tracheal deviation present.  Cardiovascular: Normal rate, regular rhythm and intact distal pulses.   Pulmonary/Chest: Effort normal and breath sounds normal. No respiratory distress. She has no wheezes. She has no rales.  Abdominal: Soft. Bowel sounds are normal. She exhibits no distension. There is no tenderness. There is no guarding.  Musculoskeletal: Normal range of motion. She exhibits no edema.  Lymphadenopathy:    She has no cervical adenopathy.  Neurological: She is alert and oriented to person, place, and time.  Skin: Skin is warm and dry.    Assessment & Plan:   See Encounters Tab for problem based charting.  Patient seen with Dr. Cyndie ChimeGranfortuna

## 2016-01-31 ENCOUNTER — Institutional Professional Consult (permissible substitution): Payer: Medicare Other | Admitting: Internal Medicine

## 2016-01-31 NOTE — Progress Notes (Signed)
Medicine attending: Medical history, presenting problems, physical findings, and medications, reviewed with resident physician Dr Vasu Rathore on the day of the patient visit and I concur with her evaluation and management plan. 

## 2016-02-01 ENCOUNTER — Telehealth: Payer: Self-pay

## 2016-02-01 NOTE — Telephone Encounter (Signed)
Requesting lab result. Please call pt back.  

## 2016-02-02 LAB — QUANTIFERON IN TUBE
QFT TB AG MINUS NIL VALUE: 0 IU/mL
QUANTIFERON MITOGEN VALUE: 7.81 [IU]/mL
QUANTIFERON TB AG VALUE: 0.04 IU/mL
QUANTIFERON TB GOLD: NEGATIVE
Quantiferon Nil Value: 0.04 IU/mL

## 2016-02-02 LAB — QUANTIFERON TB GOLD ASSAY (BLOOD)

## 2016-02-04 NOTE — Addendum Note (Signed)
Addended by: Neomia DearPOWERS, Charlesetta E on: 02/04/2016 05:40 PM   Modules accepted: Orders

## 2016-02-06 NOTE — Telephone Encounter (Signed)
Spoke to the patient over the phone on 02/04/16. Thanks.

## 2016-02-25 ENCOUNTER — Encounter: Payer: Self-pay | Admitting: Internal Medicine

## 2016-02-25 ENCOUNTER — Other Ambulatory Visit: Payer: Self-pay | Admitting: Internal Medicine

## 2016-02-25 ENCOUNTER — Ambulatory Visit (INDEPENDENT_AMBULATORY_CARE_PROVIDER_SITE_OTHER): Payer: Medicare Other | Admitting: Internal Medicine

## 2016-02-25 ENCOUNTER — Ambulatory Visit (INDEPENDENT_AMBULATORY_CARE_PROVIDER_SITE_OTHER)
Admission: RE | Admit: 2016-02-25 | Discharge: 2016-02-25 | Disposition: A | Discharge status: Home / Self Care | Payer: Medicare Other | Source: Ambulatory Visit | Attending: Internal Medicine | Admitting: Internal Medicine

## 2016-02-25 ENCOUNTER — Other Ambulatory Visit (INDEPENDENT_AMBULATORY_CARE_PROVIDER_SITE_OTHER): Payer: Medicare Other

## 2016-02-25 ENCOUNTER — Telehealth: Payer: Self-pay | Admitting: Internal Medicine

## 2016-02-25 VITALS — BP 124/80 | HR 92 | Ht 64.0 in | Wt 183.0 lb

## 2016-02-25 DIAGNOSIS — Z794 Long term (current) use of insulin: Secondary | ICD-10-CM

## 2016-02-25 DIAGNOSIS — R918 Other nonspecific abnormal finding of lung field: Secondary | ICD-10-CM

## 2016-02-25 DIAGNOSIS — E0851 Diabetes mellitus due to underlying condition with diabetic peripheral angiopathy without gangrene: Secondary | ICD-10-CM

## 2016-02-25 DIAGNOSIS — I10 Essential (primary) hypertension: Secondary | ICD-10-CM | POA: Diagnosis not present

## 2016-02-25 DIAGNOSIS — E0859 Diabetes mellitus due to underlying condition with other circulatory complications: Secondary | ICD-10-CM | POA: Insufficient documentation

## 2016-02-25 DIAGNOSIS — F17210 Nicotine dependence, cigarettes, uncomplicated: Secondary | ICD-10-CM | POA: Diagnosis not present

## 2016-02-25 LAB — HEMOGLOBIN A1C: HEMOGLOBIN A1C: 10.4 % — AB (ref 4.6–6.5)

## 2016-02-25 LAB — URINALYSIS, ROUTINE W REFLEX MICROSCOPIC
Bilirubin Urine: NEGATIVE
Ketones, ur: NEGATIVE
Leukocytes, UA: NEGATIVE
Nitrite: NEGATIVE
PH: 6 (ref 5.0–8.0)
Specific Gravity, Urine: 1.02 (ref 1.000–1.030)
TOTAL PROTEIN, URINE-UPE24: 100 — AB
Urine Glucose: 100 — AB
Urobilinogen, UA: 0.2 (ref 0.0–1.0)

## 2016-02-25 LAB — SEDIMENTATION RATE: Sed Rate: 89 mm/hr — ABNORMAL HIGH (ref 0–30)

## 2016-02-25 MED ORDER — VALSARTAN 160 MG PO TABS: 160.0000 mg | ORAL_TABLET | Freq: Every day | ORAL | 11 refills | Status: DC

## 2016-02-25 NOTE — Telephone Encounter (Signed)
Called and spoke to pt. Pt states she already received the results from [facility]. Nothing further needed at this time..Marland Kitchen

## 2016-02-25 NOTE — Progress Notes (Signed)
Spoke with pt and notified of results per Dr. Wert. Pt verbalized understanding and denied any questions. 

## 2016-02-25 NOTE — Progress Notes (Signed)
LMTCB

## 2016-02-25 NOTE — Progress Notes (Signed)
Subjective:     Patient ID: Alison Duncan, female   DOB: [birth year],    MRN: [id number]  HPI   [age] yobf active smoker cough x 2007 while living in [place] same problem p moved to [place] spring 2017  Much worse with URI in Sept 2017 eval in ER on 01/25/16 with abn cxr vs "nl"  reported [place] in Aug 2016 referred to pulmonary clinic 02/25/2016 by Dr   Loney Lohathore with new MPNs seen on plain cxr 01/25/16 confirmed on CT    02/25/2016 1st [facility] office visit/ Levante Simones   Chief Complaint  Patient presents with  . Pulmonary Consult    Referred by Dr. Loney Lohathore for eval of pulmonary nodules. Pt c/o cough since end of Sept 2017- improved some with recent abx. She also c/o SOB with walking up steps or running for the past year.   cough x 10 y on acei =  dry hack, worse acutely in Sept 2017 > ER  01/25/16 >  some better p abx x 2 (omnicef/ zmax) Mostly daytime, non productive assoc with worse doe = MMRC1 = can walk nl pace, flat grade, can't hurry or go uphills or steps s sob     No obvious day to day or daytime variability or assoc excess/ purulent sputum or mucus plugs or hemoptysis or cp or chest tightness, subjective wheeze or overt sinus or hb symptoms. No unusual exp hx or h/o childhood pna/ asthma or knowledge of premature birth.  Sleeping ok without nocturnal  or early am exacerbation  of respiratory  c/o's or need for noct saba. Also denies any obvious fluctuation of symptoms with weather or environmental changes or other aggravating or alleviating factors except as outlined above   Current Medications, Allergies, Complete Past Medical History, Past Surgical History, Family History, and Social History were reviewed in [facility] electronic medical record.  ROS  The following are not active complaints unless bolded sore throat, dysphagia, dental problems, itching, sneezing,  nasal congestion or excess/ purulent secretions, ear ache,   fever, chills, sweats, unintended wt loss, classically  pleuritic or exertional cp,  orthopnea pnd or leg swelling, presyncope, palpitations, abdominal pain, anorexia, nausea, vomiting, diarrhea  or change in bowel or bladder habits, change in stools or urine, dysuria,hematuria,  rash, arthralgias, visual complaints, headache, numbness, weakness or ataxia or problems with walking or coordination,  change in mood/affect or memory.           Review of Systems     Objective:   Physical Exam amb bf nad   Wt Readings from Last 3 Encounters:  02/25/16 183 lb (83 kg)  01/30/16 184 lb 12.8 oz (83.8 kg)  01/29/16 185 lb (83.9 kg)    Vital signs reviewed     HEENT: nl dentition, turbinates, and oropharynx. Nl external ear canals without cough reflex   NECK :  without JVD/Nodes/TM/ nl carotid upstrokes bilaterally   LUNGS: no acc muscle use,  Nl contour chest with bilateral insp and exp rhonchi and transmitted pseudowheezing   CV:  RRR  no s3 or murmur or increase in P2, no edema   ABD:  soft and nontender with nl inspiratory excursion in the supine position. No bruits or organomegaly, bowel sounds nl  MS:  Nl gait/ ext warm without deformities, calf tenderness, cyanosis or clubbing No obvious joint restrictions   SKIN: warm and dry without lesions    NEURO:  alert, approp, nl sensorium with  no motor deficits  I personally reviewed images and agree with radiology impression as follows:  CT10/13/17  Chest      1. Innumerable small nodules throughout the mid and upper lungs bilaterally, many with central cavitation, many with surrounding ground-glass density. Differential includes atypical infection such as fungal (aspergillosis) and viral (CMV, influenza, varicella. etc). Differential also includes septic emboli and metastases (hemorrhagic). Less likely differential would also include vasculitides and hypersensitivity pneumonitis.  Labs ordered 02/25/2016  :   Hsp/collagen vasc/ crypto/ anca serologies         Assessment:

## 2016-02-25 NOTE — Patient Instructions (Signed)
Stop lisinopril and start diovan 160 mg daily   Please remember to go to the lab and x-ray department downstairs for your tests - we will call you with the results when they are available.     The key is to stop smoking completely before smoking completely stops you!    I will call you re lung biopsy once all the studies are back that we did today.

## 2016-02-26 DIAGNOSIS — F17210 Nicotine dependence, cigarettes, uncomplicated: Secondary | ICD-10-CM | POA: Insufficient documentation

## 2016-02-26 LAB — CRYPTOCOCCAL AG, LTX SCR RFLX TITER: Cryptococcal Ag Screen: NOT DETECTED

## 2016-02-26 LAB — CYCLIC CITRUL PEPTIDE ANTIBODY, IGG

## 2016-02-26 LAB — RHEUMATOID FACTOR: Rhuematoid fact SerPl-aCnc: <14 IU/mL (ref <14)

## 2016-02-26 LAB — ANA: ANA: NEGATIVE

## 2016-02-26 LAB — ANCA SCREEN W REFLEX TITER: ANCA SCREEN: NEGATIVE

## 2016-02-26 NOTE — Assessment & Plan Note (Addendum)
See CT chest 01/25/16  -Quantiferon GOLD TB 01/30/16 > neg  Lab Results  Component Value Date   ESRSEDRATE 89 (H) 02/25/2016   ESRSEDRATE 52 (H) 01/25/2016    - collagen vasc profile 02/25/2016 >>> - crypto serology 02/25/16 >>>   I agree with radiology's ddx.  Hard to say whether the cough is related to the nodules as does not occur on insp or exp and present x 10 years on acei for at least the last few so will start with trial off acei while waiting for serologies to return   ? Is whether to attempt fob with tbbx or go directly to vats bx here.  Discussed in detail all the  indications, usual  risks and alternatives  relative to the benefits with patient who wants to think it over.  Total time devoted to counseling  = 35/764m review case with pt/ discussion of options/alternatives/ personally creating written instructions  in presence of pt  then going over those specific  Instructions directly with the pt including how to use all of the meds but in particular covering each new medication in detail and the difference between the maintenance/automatic meds and the prns using an action plan format for the latter.

## 2016-02-26 NOTE — Assessment & Plan Note (Signed)
Lab Results  Component Value Date   HGBA1C 10.4 (H) 02/25/2016      Not well controlled Follow up per Primary Care planned

## 2016-02-26 NOTE — Assessment & Plan Note (Signed)

## 2016-02-26 NOTE — Assessment & Plan Note (Signed)

## 2016-03-02 LAB — HYPERSENSITIVITY PNUEMONITIS PROFILE

## 2016-03-05 NOTE — Progress Notes (Signed)
lmtcb

## 2016-03-07 NOTE — Progress Notes (Signed)
Spoke with pt and notified of results per Dr. Wert. Pt verbalized understanding and denied any questions. 

## 2016-03-25 ENCOUNTER — Ambulatory Visit (INDEPENDENT_AMBULATORY_CARE_PROVIDER_SITE_OTHER)
Admission: RE | Admit: 2016-03-25 | Discharge: 2016-03-25 | Disposition: A | Discharge status: Home / Self Care | Payer: Medicare Other | Source: Ambulatory Visit | Attending: Internal Medicine | Admitting: Internal Medicine

## 2016-03-25 ENCOUNTER — Encounter: Payer: Self-pay | Admitting: Internal Medicine

## 2016-03-25 ENCOUNTER — Other Ambulatory Visit (INDEPENDENT_AMBULATORY_CARE_PROVIDER_SITE_OTHER): Payer: Medicare Other

## 2016-03-25 ENCOUNTER — Ambulatory Visit (INDEPENDENT_AMBULATORY_CARE_PROVIDER_SITE_OTHER): Payer: Medicare Other | Admitting: Internal Medicine

## 2016-03-25 VITALS — BP 128/86 | HR 99 | Ht 64.0 in | Wt 185.0 lb

## 2016-03-25 DIAGNOSIS — L52 Erythema nodosum: Secondary | ICD-10-CM | POA: Diagnosis not present

## 2016-03-25 DIAGNOSIS — R918 Other nonspecific abnormal finding of lung field: Secondary | ICD-10-CM

## 2016-03-25 DIAGNOSIS — F17210 Nicotine dependence, cigarettes, uncomplicated: Secondary | ICD-10-CM

## 2016-03-25 DIAGNOSIS — I10 Essential (primary) hypertension: Secondary | ICD-10-CM | POA: Diagnosis not present

## 2016-03-25 LAB — HEPATIC FUNCTION PANEL
ALT: 11 U/L (ref 0–35)
AST: 11 U/L (ref 0–37)
Albumin: 4 g/dL (ref 3.5–5.2)
Alkaline Phosphatase: 106 U/L (ref 39–117)
BILIRUBIN DIRECT: 0.1 mg/dL (ref 0.0–0.3)
TOTAL PROTEIN: 8 g/dL (ref 6.0–8.3)
Total Bilirubin: 0.5 mg/dL (ref 0.2–1.2)

## 2016-03-25 LAB — SEDIMENTATION RATE: SED RATE: 66 mm/h — AB (ref 0–30)

## 2016-03-25 NOTE — Assessment & Plan Note (Addendum)
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

## 2016-03-25 NOTE — Patient Instructions (Addendum)
Please remember to go to the lab and x-ray department downstairs for your tests - we will call you with the results when they are available.  Please see patient coordinator before you leave today  to schedule dermatology eval   Please schedule a follow up office visit in 4 weeks, sooner if needed

## 2016-03-25 NOTE — Progress Notes (Signed)
Subjective:     Patient ID: Alison FarberSharon K Duncan, female   DOB: 1955/10/31,    MRN: [id number]     Brief patient profile:  [age] yobf active smoker cough x 2007 while living in [place] same problem p moved to [place] spring 2017  Much worse with URI in Sept 2017 eval in ER on 01/25/16 with abn cxr vs "nl"  reported [place] in Aug 2016 referred to pulmonary clinic 02/25/2016 by Dr   Loney Lohathore with new MPNs seen on plain cxr 01/25/16 confirmed on CT     History of Present Illness  02/25/2016 1st [facility] office visit/ Alison Duncan   Chief Complaint  Patient presents with  . Pulmonary Consult    Referred by Dr. Loney Lohathore for eval of pulmonary nodules. Pt c/o cough since end of Sept 2017- improved some with recent abx. She also c/o SOB with walking up steps or running for the past year.   cough x 10 y on acei =  dry hack, worse acutely in Sept 2017 > ER  01/25/16 >  some better p abx x 2 (omnicef/ zmax) Mostly daytime, non productive assoc with worse doe = MMRC1 = can walk nl pace, flat grade, can't hurry or go uphills or steps s sob    rec Stop lisinopril and start diovan 160 mg daily  Please remember to go to the lab and x-ray department downstairs for your tests - we will call you with the results when they are available. The key is to stop smoking completely before smoking completely stops you!     03/25/2016  f/u ov/Alison Duncan re:  MPNs? EN/ still smoking  Chief Complaint  Patient presents with  . Follow-up    Breathing is unchanged. She is coughing less.  She c/o knots in her lower legs,painful.  about 3 weeks prior to OV abrupt onset Painful knots both legs 2 on L and one on R, improving since first noted / no arthralgias - Not limited by breathing from desired activitie, coughing def less since off acei      No obvious day to day or daytime variability or assoc excess/ purulent sputum or mucus plugs or hemoptysis or cp or chest tightness, subjective wheeze or overt sinus or hb symptoms. No unusual exp hx or  h/o childhood pna/ asthma or knowledge of premature birth.  Sleeping ok without nocturnal  or early am exacerbation  of respiratory  c/o's or need for noct saba. Also denies any obvious fluctuation of symptoms with weather or environmental changes or other aggravating or alleviating factors except as outlined above   Current Medications, Allergies, Complete Past Medical History, Past Surgical History, Family History, and Social History were reviewed in [facility] electronic medical record.  ROS  The following are not active complaints unless bolded sore throat, dysphagia, dental problems, itching, sneezing,  nasal congestion or excess/ purulent secretions, ear ache,   fever, chills, sweats, unintended wt loss, classically pleuritic or exertional cp,  orthopnea pnd or leg swelling, presyncope, palpitations, abdominal pain, anorexia, nausea, vomiting, diarrhea  or change in bowel or bladder habits, change in stools or urine, dysuria,hematuria,  rash, arthralgias, visual complaints, headache, numbness, weakness or ataxia or problems with walking or coordination,  change in mood/affect or memory.             Objective:  Physical Exam   amb bf nad/ minimal smoker's rattle on voluntary cough    03/25/2016      185   02/25/16 183 lb (  83 kg)  01/30/16 184 lb 12.8 oz (83.8 kg)  01/29/16 185 lb (83.9 kg)    Vital signs reviewed - Note on arrival 02 sats  98% on RA     HEENT: nl dentition, turbinates, and oropharynx. Nl external ear canals without cough reflex   NECK :  without JVD/Nodes/TM/ nl carotid upstrokes bilaterally   LUNGS: no acc muscle use,  Nl contour chest with minimal exp rhonchi    CV:  RRR  no s3 or murmur or increase in P2, no edema   ABD:  soft and nontender with nl inspiratory excursion in the supine position. No bruits or organomegaly, bowel sounds nl  MS:  Nl gait/ ext warm without deformities, calf tenderness, cyanosis or clubbing No obvious joint  restrictions   SKIN: warm and dry - ? Classic EN on both chins > one on R and two on L   NEURO:  alert, approp, nl sensorium with  no motor deficits     I personally reviewed images and agree with radiology impression as follows:  CXR:   Slight interval increase in the conspicuity of the subtle pulmonary nodules bilaterally. There is no alveolar pneumonia nor CHF.      Lab Results  Component Value Date   ESRSEDRATE 66 (H) 03/25/2016   ESRSEDRATE 89 (H) 02/25/2016   ESRSEDRATE 52 (H) 01/25/2016     Labs ordered 03/25/2016  = ACE level          Assessment:

## 2016-03-25 NOTE — Progress Notes (Signed)
LMTCB

## 2016-03-26 LAB — ANGIOTENSIN CONVERTING ENZYME: ANGIOTENSIN-CONVERTING ENZYME: 7 U/L — AB (ref 9–67)

## 2016-03-26 NOTE — Assessment & Plan Note (Signed)
>   3 min Discussed the risks and costs (both direct and indirect)  of smoking relative to the benefits of quitting but patient unwilling to commit at this point to a specific quit date.    Although I don't endorse regular use of e cigs/ many pts find them helpful; however, I emphasized they should be considered a "one-way bridge" off all tobacco products.  

## 2016-03-26 NOTE — Assessment & Plan Note (Addendum)
Cough >> Trial off acei 02/25/2016 > improved 03/25/2016 on diovan 160   Adequate control on present rx, reviewed in detail with pt > no change in rx needed    Although even in retrospect it may not be clear the ACEi contributed to the pt's symptoms,  Pt improved off them and adding them back at this point or in the future would risk confusion in interpretation of non-specific respiratory symptoms to which this patient is prone  ie  Better not to muddy the waters here.

## 2016-03-26 NOTE — Assessment & Plan Note (Signed)
See CT chest 01/25/16  -Quantiferon GOLD TB 01/30/16 > neg  Lab Results  Component Value Date   ESRSEDRATE 66 (H) 03/25/2016   ESRSEDRATE 89 (H) 02/25/2016   ESRSEDRATE 52 (H) 01/25/2016    - collagen vasc profile 02/25/2016 >>> neg - crypto serology 02/25/16 >>> neg  - u/a  02/25/16 n trace urine/ hgb with neg rbc's r rbc casts - late Nov 2017 developed ? 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  Discussed in detail all the  indications, usual  risks and alternatives  relative to the benefits with patient who agrees to proceed with conservative f/u as outlined  With derm eval then f/u p first of year to consider bronchoscopy if there is no clear diagnosis in the interim per dermatology.  In any case, there is no role for prednisone and so there is no role for immediate biopsy here.  I had an extended discussion with the patient reviewing all relevant studies completed to date and  lasting 15 to 20 minutes of a 25 minute visit    Each maintenance medication was reviewed in detail including most importantly the difference between maintenance and prns and under what circumstances the prns are to be triggered using an action plan format that is not reflected in the computer generated alphabetically organized AVS.    Please see AVS for unique instructions that I personally wrote and verbalized to the the pt in detail and then reviewed with pt  by my nurse highlighting any  changes in therapy recommended at today's visit to their plan of care.

## 2016-03-28 ENCOUNTER — Other Ambulatory Visit: Payer: Self-pay | Admitting: Internal Medicine

## 2016-03-28 DIAGNOSIS — L52 Erythema nodosum: Secondary | ICD-10-CM

## 2016-03-28 NOTE — Progress Notes (Signed)
Spoke with pt and notified of results per Dr. Wert. Pt verbalized understanding and denied any questions. 

## 2016-04-16 ENCOUNTER — Encounter (HOSPITAL_COMMUNITY): Payer: Self-pay | Admitting: Emergency Medicine

## 2016-04-16 ENCOUNTER — Ambulatory Visit (HOSPITAL_COMMUNITY)
Admission: EM | Admit: 2016-04-16 | Discharge: 2016-04-16 | Disposition: A | Discharge status: Home / Self Care | Payer: Medicare Other | Attending: Emergency Medicine | Admitting: Emergency Medicine

## 2016-04-16 DIAGNOSIS — N760 Acute vaginitis: Secondary | ICD-10-CM | POA: Diagnosis not present

## 2016-04-16 DIAGNOSIS — B9689 Other specified bacterial agents as the cause of diseases classified elsewhere: Secondary | ICD-10-CM | POA: Diagnosis not present

## 2016-04-16 LAB — POCT URINALYSIS DIP (DEVICE)
BILIRUBIN URINE: NEGATIVE
Glucose, UA: 250 mg/dL — AB
Ketones, ur: NEGATIVE mg/dL
Leukocytes, UA: NEGATIVE
NITRITE: NEGATIVE
PH: 6.5 (ref 5.0–8.0)
PROTEIN: 100 mg/dL — AB
Specific Gravity, Urine: 1.015 (ref 1.005–1.030)
Urobilinogen, UA: 0.2 mg/dL (ref 0.0–1.0)

## 2016-04-16 MED ORDER — METRONIDAZOLE 500 MG PO TABS: 500.0000 mg | ORAL_TABLET | Freq: Two times a day (BID) | ORAL | 0 refills | Status: AC

## 2016-04-16 NOTE — Discharge Instructions (Signed)
This is likely bacterial vaginosis. This is caused by a disruption of the normal bacteria in the vagina. In your case, likely from the discount wipes. I recommend using only water and a mild soap to clean this area. Take Flagyl twice a day for 7 days. If symptoms persist, please follow-up.

## 2016-04-16 NOTE — ED Provider Notes (Signed)
[facility]    CSN: [id number] Arrival date & time: 04/16/16  1123     History   Chief Complaint Chief Complaint  Patient presents with  . Vaginal Itching    HPI Alison Duncan is a 61 y.o. female.   HPI She is a 61 year old woman here for evaluation of vaginal odor. She states that a month ago, she used a discount feminine white and since then has had some burning and irritation of the vagina. This is associated with an odor and intermittent discharge. She is also had some lower abdominal discomfort. She does report some dysuria, but states it's more when the urine hits the skin. No frequency or urgency. No fevers. She is not currently sexually active. No concern for STDs.  She had a similar thing happen when she was living in [place] and her doctor told her it was a "bacterial infection."  Past Medical History:  Diagnosis Date  . Diabetes mellitus without complication (HCC)   . Hypertension     Patient Active Problem List   Diagnosis Date Noted  . 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 Cigarette smoker 02/26/2016  . 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 Essential hypertension 02/25/2016  . Multiple pulmonary nodules 01/30/2016    Past Surgical History:  Procedure Laterality Date  . CARPAL TUNNEL RELEASE    . VASCULAR SURGERY     STENT IN LEFT LEG.    OB History    No data available       Home Medications    Prior to Admission medications   Medication Sig Start Date End Date Taking? Authorizing Provider  lisinopril (PRINIVIL,ZESTRIL) 20 MG tablet Take 20 mg by mouth daily.   Yes Historical Provider, MD  aspirin 81 MG tablet Take 81 mg by mouth daily.    Historical Provider, MD  insulin detemir (LEVEMIR) 100 UNIT/ML injection Inject into the skin at bedtime. 27 units daily    Historical Provider, MD  Iron Combinations (NIFEREX PO) Take 1 tablet by mouth every 7 (seven) days.     Historical  Provider, MD  metroNIDAZOLE (FLAGYL) 500 MG tablet Take 1 tablet (500 mg total) by mouth 2 (two) times daily. 04/16/16   Charm RingsErin J Honig, MD  NIFEdipine (PROCARDIA XL/ADALAT-CC) 60 MG 24 hr tablet Take 120 mg by mouth daily.    Historical Provider, MD  pantoprazole (PROTONIX) 40 MG tablet Take 40 mg by mouth daily. 02/19/16   Historical Provider, MD  QUEtiapine (SEROQUEL) 25 MG tablet Take 25 mg by mouth daily. 02/19/16   Historical Provider, MD  sitaGLIPtin (JANUVIA) 50 MG tablet Take 100 mg by mouth daily.     Historical Provider, MD    Family History No family history on file.  Social History Social History  Substance Use Topics  . Smoking status: Current Every Day Smoker    Packs/day: 0.25    Years: 20.00    Types: Cigarettes  . Smokeless tobacco: Never Used  . Alcohol use No     Allergies   Patient has no known allergies.   Review of Systems Review of Systems As in history of present illness  Physical Exam Triage Vital Signs ED Triage Vitals [04/16/16 1209]  Enc Vitals Group     BP      Pulse      Resp      Temp      Temp src      SpO2  Weight      Height      Head Circumference      Peak Flow      Pain Score 8     Pain Loc      Pain Edu?      Excl. in GC?    No data found.   Updated Vital Signs BP 130/96 (BP Location: Right Arm)   Pulse 76   Temp 97.8 F (36.6 C) (Oral)   Resp 18   SpO2 98%   Visual Acuity Right Eye Distance:   Left Eye Distance:   Bilateral Distance:    Right Eye Near:   Left Eye Near:    Bilateral Near:     Physical Exam  Constitutional: She is oriented to person, place, and time. She appears well-developed and well-nourished. No distress.  Cardiovascular: Normal rate.   Pulmonary/Chest: Effort normal.  Genitourinary: There is no rash on the right labia. There is no rash on the left labia. Cervix exhibits no motion tenderness. No bleeding in the vagina. No foreign body in the vagina. Vaginal discharge (white with odor)  found.  Neurological: She is alert and oriented to person, place, and time.     UC Treatments / Results  Labs (all labs ordered are listed, but only abnormal results are displayed) Labs Reviewed  POCT URINALYSIS DIP (DEVICE) - Abnormal; Notable for the following:       Result Value   Glucose, UA 250 (*)    Hgb urine dipstick MODERATE (*)    Protein, ur 100 (*)    All other components within normal limits  URINE CULTURE  CERVICOVAGINAL ANCILLARY ONLY    EKG  EKG Interpretation None       Radiology No results found.  Procedures Procedures (including critical care time)  Medications Ordered in UC Medications - No data to display   Initial Impression / Assessment and Plan / UC Course  I have reviewed the triage vital signs and the nursing notes.  Pertinent labs & imaging results that were available during my care of the patient were reviewed by me and considered in my medical decision making (see chart for details).  Clinical Course     Likely BV by history. Treat with Flagyl. Urine sent for culture given hematuria. Follow up as needed.  Final Clinical Impressions(s) / UC Diagnoses   Final diagnoses:  BV (bacterial vaginosis)    New Prescriptions Discharge Medication List as of 04/16/2016 12:35 PM    START taking these medications   Details  metroNIDAZOLE (FLAGYL) 500 MG tablet Take 1 tablet (500 mg total) by mouth 2 (two) times daily., Starting Wed 04/16/2016, Normal         Charm Rings, MD 04/16/16 1308

## 2016-04-16 NOTE — ED Triage Notes (Signed)
Noticed vaginal burning in November, noticed odor for a week.  Does not notice as much discharge as there is an odor.  Patient has been told in the past this is a bacterial discharge. Patient has low abdominal pain

## 2016-04-17 LAB — URINE CULTURE

## 2016-04-17 LAB — CERVICOVAGINAL ANCILLARY ONLY: WET PREP (BD AFFIRM): NEGATIVE

## 2016-04-22 ENCOUNTER — Ambulatory Visit: Payer: Medicare Other | Admitting: Internal Medicine

## 2018-05-28 IMAGING — DX DG CHEST 2V
2 series · 2 of 2 positions shown · non-contrast
Comparison: 01/25/2016 chest x-ray and chest CT.

CLINICAL DATA: 60-year-old female with productive cough and dyspnea
on exertion. Subsequent encounter.

EXAM:
CHEST  2 VIEW

[chest pa]
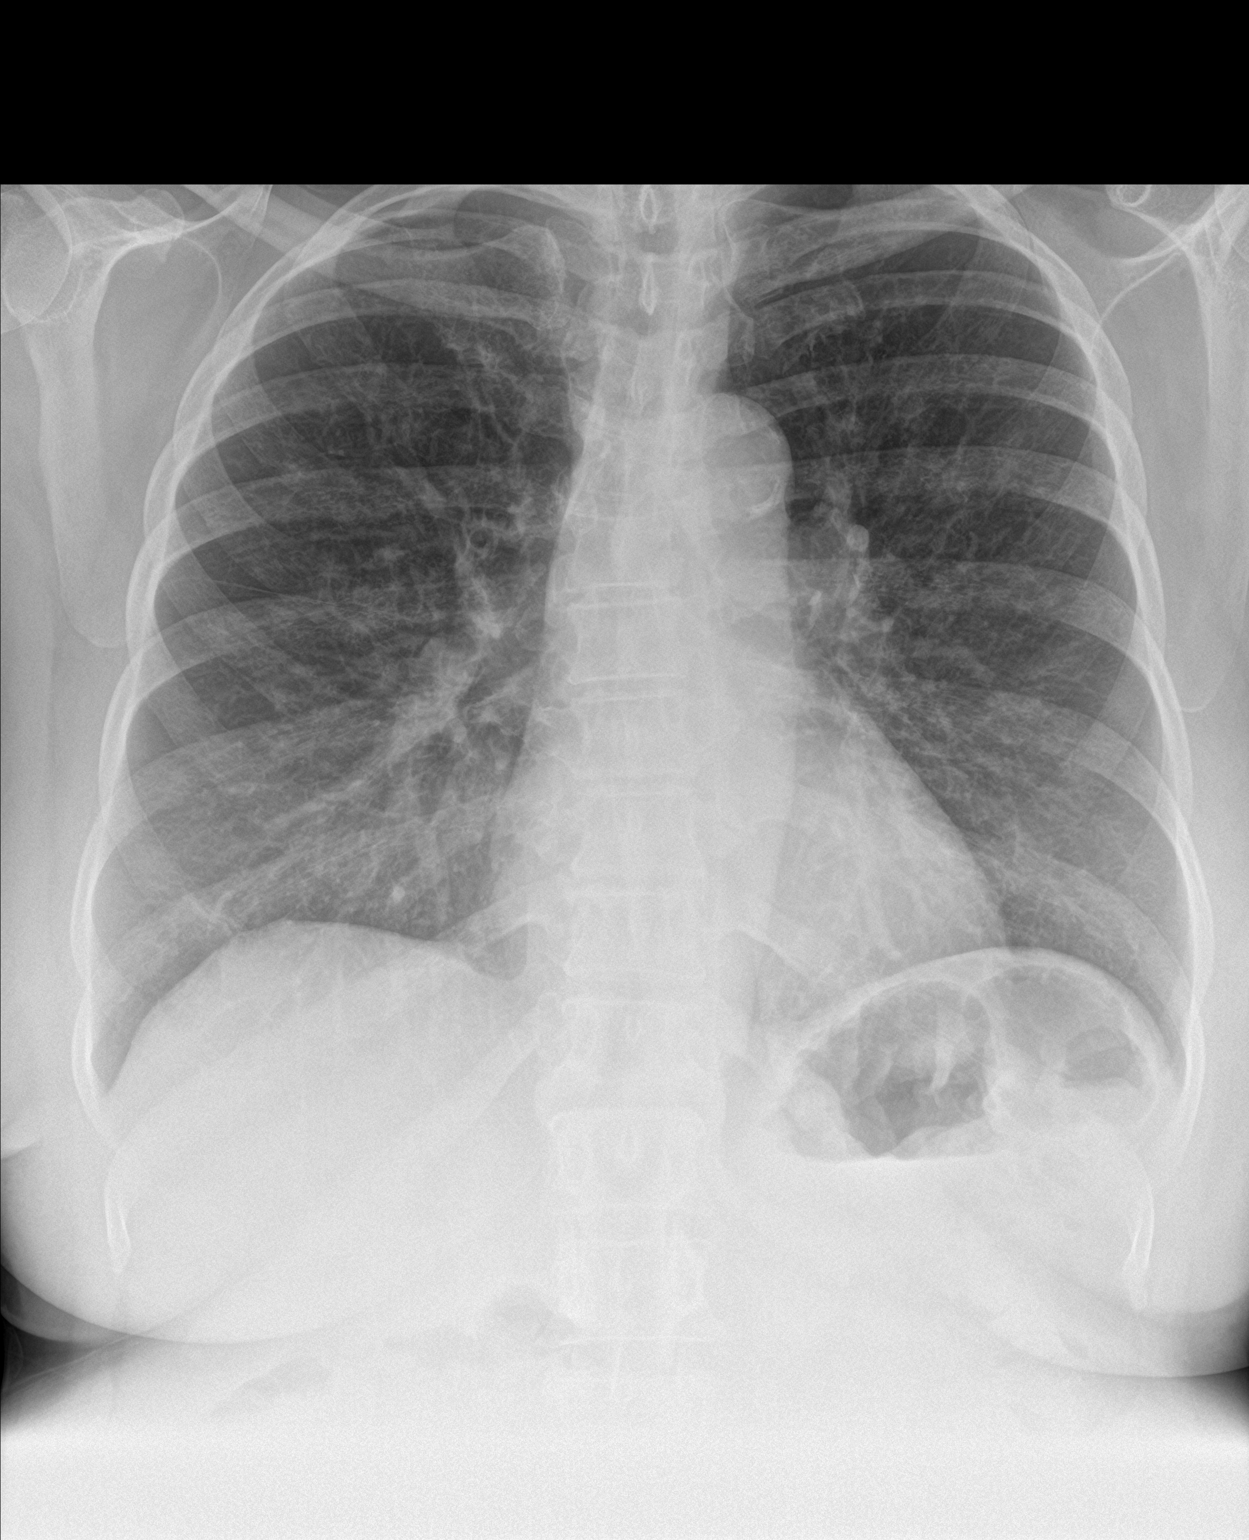

[chest lat]
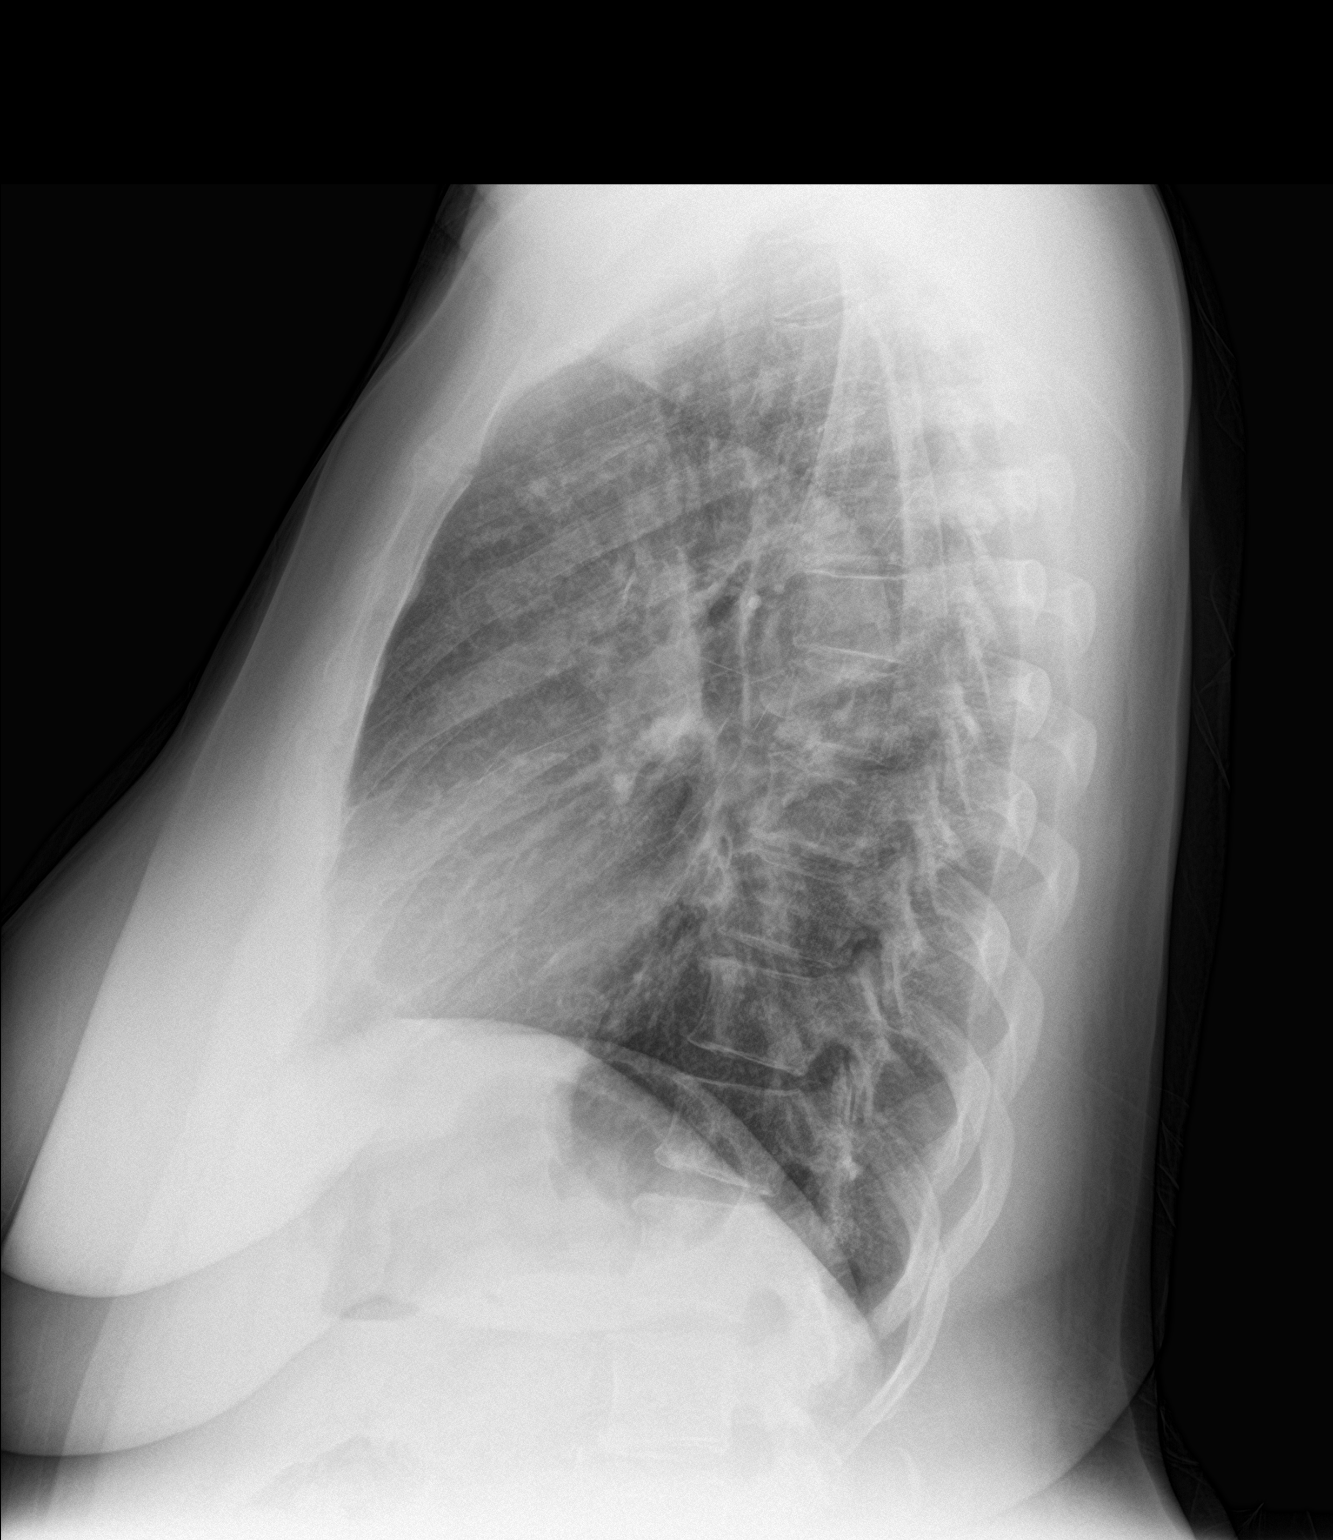

[2 of 2 positions shown; findings below may reference images not displayed]

FINDINGS: Similar appearance of multiple pulmonary nodules (some noted to be
cavitary on recent CT). Differential as noted on recent CT.

Stable central pulmonary vascular prominence.

Mild scoliosis thoracic spine convex right.

Heart size within normal limits.

Vascular calcifications.
IMPRESSION: Similar appearance of multiple pulmonary nodules (some noted to be
cavitary on recent CT). Differential as noted on recent CT.
